# Patient Record
Sex: Female | Born: 1980
Health system: Southern US, Community
[De-identification: ages and names within clinical notes are randomized; demographics above are authoritative.]

---

## 2005-07-21 ENCOUNTER — Emergency Department (HOSPITAL_COMMUNITY): Admission: EM | Admit: 2005-07-21 | Discharge: 2005-07-21 | Payer: Self-pay | Admitting: Family Medicine

## 2005-08-09 ENCOUNTER — Emergency Department (HOSPITAL_COMMUNITY): Admission: EM | Admit: 2005-08-09 | Discharge: 2005-08-09 | Payer: Self-pay | Admitting: Family Medicine

## 2005-10-02 ENCOUNTER — Emergency Department (HOSPITAL_COMMUNITY): Admission: EM | Admit: 2005-10-02 | Discharge: 2005-10-03 | Payer: Self-pay | Admitting: *Deleted

## 2005-10-24 ENCOUNTER — Ambulatory Visit: Payer: Self-pay | Admitting: Obstetrics and Gynecology

## 2005-10-24 ENCOUNTER — Encounter: Payer: Self-pay | Admitting: Obstetrics and Gynecology

## 2005-11-11 ENCOUNTER — Ambulatory Visit: Payer: Self-pay | Admitting: Family Medicine

## 2005-11-11 ENCOUNTER — Encounter (INDEPENDENT_AMBULATORY_CARE_PROVIDER_SITE_OTHER): Payer: Self-pay | Admitting: Specialist

## 2005-11-11 ENCOUNTER — Inpatient Hospital Stay (HOSPITAL_COMMUNITY): Admission: RE | Admit: 2005-11-11 | Discharge: 2005-11-13 | Payer: Self-pay | Admitting: Obstetrics and Gynecology

## 2005-11-28 ENCOUNTER — Ambulatory Visit: Payer: Self-pay | Admitting: Obstetrics & Gynecology

## 2005-12-18 ENCOUNTER — Ambulatory Visit: Payer: Self-pay | Admitting: Obstetrics and Gynecology

## 2005-12-23 ENCOUNTER — Ambulatory Visit: Payer: Self-pay | Admitting: Internal Medicine

## 2006-01-02 ENCOUNTER — Ambulatory Visit: Payer: Self-pay | Admitting: Obstetrics and Gynecology

## 2006-06-30 ENCOUNTER — Emergency Department (HOSPITAL_COMMUNITY): Admission: EM | Admit: 2006-06-30 | Discharge: 2006-06-30 | Payer: Self-pay | Admitting: Family Medicine

## 2006-06-30 ENCOUNTER — Ambulatory Visit (HOSPITAL_COMMUNITY): Admission: RE | Admit: 2006-06-30 | Discharge: 2006-06-30 | Payer: Self-pay | Admitting: Family Medicine

## 2006-07-11 ENCOUNTER — Encounter: Admission: RE | Admit: 2006-07-11 | Discharge: 2006-07-11 | Payer: Self-pay | Admitting: General Surgery

## 2006-07-16 ENCOUNTER — Ambulatory Visit (HOSPITAL_COMMUNITY): Admission: RE | Admit: 2006-07-16 | Discharge: 2006-07-16 | Payer: Self-pay | Admitting: General Surgery

## 2006-08-15 ENCOUNTER — Emergency Department (HOSPITAL_COMMUNITY): Admission: EM | Admit: 2006-08-15 | Discharge: 2006-08-15 | Payer: Self-pay | Admitting: Family Medicine

## 2006-08-26 ENCOUNTER — Ambulatory Visit (HOSPITAL_COMMUNITY): Admission: RE | Admit: 2006-08-26 | Discharge: 2006-08-27 | Payer: Self-pay | Admitting: General Surgery

## 2006-08-26 ENCOUNTER — Encounter (INDEPENDENT_AMBULATORY_CARE_PROVIDER_SITE_OTHER): Payer: Self-pay | Admitting: Specialist

## 2006-10-30 ENCOUNTER — Emergency Department (HOSPITAL_COMMUNITY): Admission: EM | Admit: 2006-10-30 | Discharge: 2006-10-30 | Payer: Self-pay | Admitting: Family Medicine

## 2007-02-26 ENCOUNTER — Ambulatory Visit: Payer: Self-pay | Admitting: *Deleted

## 2008-01-06 ENCOUNTER — Ambulatory Visit: Payer: Self-pay | Admitting: Family Medicine

## 2010-10-09 NOTE — Group Therapy Note (Signed)
NAMEALANTRA, Madison Jordan          ACCOUNT NO.:  [id number]   MEDICAL RECORD NO.:  [id number]          PATIENT TYPE:  WOC   LOCATION:  [facility]                   FACILITY:  [facility]   PHYSICIAN:  Karlton Lemon, MD      DATE OF BIRTH:  Jan 31, 1981   DATE OF SERVICE:                                  CLINIC NOTE   CHIEF COMPLAINT:  Painful urination, discharge, itching and vaginal  odor.   HISTORY OF PRESENT ILLNESS:  This is a 30 year old African American  female with history of vaginal hysterectomy performed on November 11, 2005  by Dr. Okey Dupre presenting with complaints of painful urination for the past  2-3 days.  She also notes that she did have some bloody colored urine  yesterday, though that has resolved now.  She continues to have the pain  with urination.  She also noticed some thin, clearish colored discharge.  She has some vaginal itching and a fishy odor.  She is currently  sexually active with one partner.   PAST MEDICAL HISTORY:  None.   PAST SURGICAL HISTORY:  Vaginal hysterectomy secondary to dysmenorrhea  and adenomyosis.   MEDICATIONS:  None.   ALLERGIES:  ZITHROMAX.   PHYSICAL EXAMINATION:  GENERAL:  This is a well-appearing African  American female in no distress.  VITALS:  Temperature 97.2, pulse 60, blood pressure 106/68.  CARDIOVASCULAR:  Heart is regular rate and rhythm, no murmurs, rubs or  gallops.  RESPIRATORY:  Lungs are clear to auscultation bilaterally.  GENITOURINARY:  Normal female external genitalia, vaginal mucosa is pink  and moist with a whitish thick discharge noted as well as a thin whitish  discharge adherent to the vaginal walls.  Cervix is absent.  Wet prep is  collected.   LABORATORY:  UA was performed, showed no abnormalities.  Wet prep was  preformed showing  a few yeast and few clue cells.  KOH elicited a fishy  odor when applied to the solution, pH was not performed.   ASSESSMENT/PLAN:  This is a 30 year old female presenting with  painful  urination, vaginal discharge, vaginal odor with bacterial vaginosis and  vaginal candidiasis.  1. We will treat for bacterial vaginosis with Flagyl 500 mg p.o.      b.i.d. for 7 days.  2. We will treat for vaginal candidiasis with Diflucan 150 mg times 1      dose then repeat the dose in 3 days.  3. Patient is to follow up if symptoms do not resolve with current      treatment.           ______________________________  Karlton Lemon, MD     NS/MEDQ  D:  02/26/2007  T:  02/27/2007  Job:  [id number]

## 2010-10-12 NOTE — Group Therapy Note (Signed)
NAME:  Madison Jordan, Madison Jordan          ACCOUNT NO.:  [id number]   MEDICAL RECORD NO.:  [id number]          PATIENT TYPE:  WOC   LOCATION:  [facility]                   FACILITY:  [facility]   PHYSICIAN:  Argentina Donovan, MD        DATE OF BIRTH:  May 09, 1981   DATE OF SERVICE:  10/24/2005                                    CLINIC NOTE   SUBJECTIVE:  The patient is a 30 year old gravida 3, para 3-0-0-3 with  children between the age of 2 and 4, who is 6-feet tall and weighs 161  pounds, who comes in with a complaint of increasingly severe disabling  dysmenorrhea, going into her back and into her upper thighs, with increasing  heavy bleeding and clots during her period.  It is bad enough that she was  unable to drive he children to school with her periods lately.  She has a  history since her babies of markedly severe varices of her lower extremities  for a 30 year old young lady.   OBJECTIVE:  PELVIC:  On examination, external genitalia are normal, BUS  within normal limits.  The vagina is clean and well rugate.  The cervix is  clean and parous, hypertrophied to some degree.  The uterus is retroverted  __________ , but mobile out of the cul-de-sac and of normal size, shape and  consistency.  The adnexa are normal.   ASSESSMENT:  My impression is that the patient has probable pelvic varices  and with superimposed pelvic congestion syndrome as well as adenomyosis  aggravated by a retroverted uterus.   PLAN:  I have talked to the patient about conservative treatment, although I  have my doubts that any would work.  She has had a tubal ligation after her  last baby and wants no further children, but says she cannot continue  functioning this way.  I am planning a vaginal hysterectomy because of  intractable dysmenorrhea and menorrhagia with probable adenomyosis and  pelvic congestion.           ______________________________  Argentina Donovan, MD     PR/MEDQ  D:  10/24/2005  T:  10/25/2005  Job:   [id number]

## 2010-10-12 NOTE — Discharge Summary (Signed)
Madison Jordan, Madison Jordan          ACCOUNT NO.:  [id number]   MEDICAL RECORD NO.:  [id number]          PATIENT TYPE:  OIB   LOCATION:  6711                         FACILITY:  [facility]   PHYSICIAN:  Gabrielle Dare. Janee Morn, M.D.DATE OF BIRTH:  19-Jan-1981   DATE OF ADMISSION:  08/26/2006  DATE OF DISCHARGE:  08/27/2006                               DISCHARGE SUMMARY   DISCHARGE DIAGNOSES:  1. Cholelithiasis.  2. Appendicolith.  3. Status post laparoscopic cholecystectomy and intraoperative      angiogram and laparoscopic appendectomy.   HISTORY OF PRESENT ILLNESS:  The patient is a 30 year old female who  yesterday underwent laparoscopic cholecystectomy and cholangiogram &  laparoscopic appendectomy.  The procedure was uncomplicated.   HOSPITAL COURSE:  Postoperative, patient had some nausea and pain  control issues, so she stayed overnight.  The nausea has resolved.  She  is tolerating p.o.  Pain is under better control on oral medication.  She is discharged home in stable condition at this time.   DISCHARGE DIET:  Low-fat.   DISCHARGE ACTIVITIES:  No lifting.   DISCHARGE MEDICATIONS:  Percocet 5/325 two every 6 hours as needed for  pain.   FOLLOWUP:  In 3 weeks      Laurell Josephs E. Janee Morn, M.D.  Electronically Signed     BET/MEDQ  D:  08/27/2006  T:  08/27/2006  Job:  [id number]

## 2010-10-12 NOTE — Op Note (Signed)
NAMEDEANNDRA, KIRLEY          ACCOUNT NO.:  [id number]   MEDICAL RECORD NO.:  [id number]          PATIENT TYPE:  OIB   LOCATION:  2854                         FACILITY:  [facility]   PHYSICIAN:  Gabrielle Dare. Janee Morn, M.D.DATE OF BIRTH:  Jan 13, 1981   DATE OF PROCEDURE:  08/26/2006  DATE OF DISCHARGE:                               OPERATIVE REPORT   PREOPERATIVE DIAGNOSES:  1. Cholelithiasis.  2. Appendicolith.   POSTOPERATIVE DIAGNOSES:  1. Cholelithiasis.  2. Appendicolith.   PROCEDURE:  1. Laparoscopic cholecystectomy with intraoperative cholangiogram.  2. Laparoscopic appendectomy.   SURGEON:  Gabrielle Dare. Janee Morn, M.D.   ASSISTANT:  Anselm Pancoast. Zachery Dakins, M.D.   ANESTHESIA:  General.   HISTORY OF PRESENT ILLNESS:  The patient is a 30 year old African-  American female with chronic episodic right upper quadrant and right  flank pain.  Evaluation included abdominal ultrasound which demonstrated  multiple gallstones.  In addition, due to her right flank pain, a CT  scan of the abdomen and pelvis was obtained to evaluate for a  nephrolithiasis that was not found, but she was found to have an  appendicolith, so we are proceeding today with elective laparoscopic  cholecystectomy with cholangiogram and laparoscopic appendectomy.   PROCEDURE IN DETAIL:  Informed consent was obtained.  The patient  received intravenous antibiotics.  She was brought to the operating  room.  General anesthesia was administered.  Her abdomen was prepped and  draped in a sterile fashion.  The patient was identified prior to  starting the procedure.  Marcaine 0.25% with epinephrine was injected in  the infraumbilical region along the previous scar from her laparoscopic-  assisted hysterectomy.  A transverse infraumbilical incision was made,  subcutaneous tissues were dissected down, revealing the fascia.  This  was divided sharply and the peritoneal cavity was entered gently under  direct vision without  difficulty.  A 0 Vicryl pursestring suture was  placed around the fascial opening and the Hasson trocar was inserted  into the abdomen and the abdomen was insufflated with carbon dioxide in  a standard fashion.  Under direct vision, an 11-mm epigastric and two 5-  mm lateral ports were placed.  Marcaine 0.25% with epinephrine was used  at all port sites.  The dome of the gallbladder was retracted  superomedially over filmy omental adhesions, along the entire length of  the gallbladder.  These were taken down with dissection and Bovie  cautery revealing the infundibulum.  This was retracted inferolaterally.  Dissection began laterally and progressed medially, easily identifying  the cystic duct and the cystic artery.  Dissection continued on the  cystic duct until a large window was made between the cystic duct, the  infundibulum over the gallbladder and the liver.  Once this was  accomplished, a clip was placed on the infundibulocystic duct junction.  Next, a small nick was made in the cystic duct and a Reddick  cholangiogram catheter was inserted.  Intraoperative cholangiogram was  obtained, demonstrating no common bile duct filling defects and a good  length of cystic duct.  Contrast flowed easily into the duodenum.  Cholangiogram catheter was removed.  Three  clips were placed proximally  on the cystic duct and it was divided.  The cystic artery was clipped  twice proximally and once distally and divided as well.  The gallbladder  was taken off the liver bed with a Bovie cautery.  It was placed in an  EndoCatch bag and removed from the abdomen via the infraumbilical port  site.  The liver bed was rechecked.  Meticulous hemostasis was assured.  The area was irrigated and the irrigation fluid returned clear.  Clips  remained in excellent position and the liver bed was dry.  Attention was  then directed to the appendix.  There were some small omental adhesions  down around the umbilical  region.  These were taken down with sharp  dissection and cautery.  They were filmy omental adhesions.  The  appendix was retracted superiorly.  The mesentery was dissected near the  base after taking down some of the filmy peritoneum.  The mesentery was  then divided with a single firing of the endoscopic GIA with a vascular  load.  The base of the appendix was then further dissected and the base  was divided with endoscopic GIA with vascular load.  Excellent  hemostasis was assured.  The appendix was placed in an EndoCatch bag and  removed from the abdomen via the epigastric port site.  The staple lines  remained intact with no hemorrhage.  The abdomen was copiously  irrigated.  The irrigation fluid returned clear.  The remainder of the  irrigation fluid was evacuated.  Staple lines were rechecked and were  dry.  Ports were then removed under direct vision.  Pneumoperitoneum was  released.  The Hasson trocar was removed.  The infraumbilical fascia was  closed by tying the 0 Vicryl pursestring suture, with care not to trap  any intraabdominal contents.  All four wounds were copiously irrigated  and the skin of each was closed with a running 4-0 Vicryl subcuticular  stitch.  Sponge, needle, and instrument counts were correct.  Benzoin,  Steri-Strips, and sterile dressings were applied.  The patient tolerated  the procedure well without apparent complication and was taken to  recovery room in stable condition.      Gabrielle Dare Janee Morn, M.D.  Electronically Signed     BET/MEDQ  D:  08/26/2006  T:  08/26/2006  Job:  [id number]   cc:   [facility]

## 2010-10-12 NOTE — Op Note (Signed)
[name], [name]          ACCOUNT NO.:  [id number]   MEDICAL RECORD NO.:  [id number]          PATIENT TYPE:  OIB   LOCATION:  9309                          [facility]ACILITY[facility]   PHYSICIAN:  Tanya S. Shawnie Pons, M.D.   DATE OF BIRTH:  March 23, 1981   DATE OF PROCEDURE:  11/11/2005  DATE OF DISCHARGE:                                 OPERATIVE REPORT   PREOPERATIVE DIAGNOSES:  1.  Pelvic pain.  2.  Dysmenorrhea.  3.  Adenomyosis.   POSTOPERATIVE DIAGNOSIS:  1.  Pelvic pain.  2.  Dysmenorrhea.  3.  Adenomyosis.   PROCEDURE:  Transvaginal hysterectomy.   SURGEON:  Shelbie Proctor. Shawnie Pons, M.D.   ASSISTANTKaroline Caldwell B. Merlene Morse, MD  Javier Glazier. Okey Dupre, M.D.   ANESTHESIA:  General and local.   SPECIMEN:  Uterus.   ESTIMATED BLOOD LOSS:  400 mL.   COMPLICATIONS:  None.   INDICATIONS FOR PROCEDURE:  The patient is a 30 year old para 3 who has  longstanding history of dysmenorrhea and pelvic pain and desired permanent  treatment.   PROCEDURE:  The patient was taken to the OR.  She was placed in the dorsal  lithotomy in Bullard stirrups.  She was prepped and draped in the usual  sterile fashion.  A weighted speculum was placed in the vagina.  The cervix  was grasped anteriorly and posteriorly with double-tooth tenaculum, injected  with 10 mL of 1% lidocaine with epinephrine and a circumferential incision  was made with a knife.  This was carried down and then blunt dissection was  used to dissect the bladder and posterior peritoneum off the uterus.  The  posterior peritoneal cavity was entered sharply and the posterior peritoneum  tagged to the posterior vaginal cuff without difficulty.  There was some  difficulty in getting in anteriorly.  The leaf of the uterosacral ligaments  were clamped, divided and suture ligated with a Heaney type stitch and held.  The uterine arteries were sequentially clamped, cut and suture ligated.  Another attempt was made to get in anteriorly and the peritoneal cavity  was  then entered sharply without difficulty.  The right tubo-ovarian pedicles  were then clamped, cut and free tie used to secure them followed by a suture  ligature bilaterally.  At that point, there appeared to be some oozing from  the posterior cuff and this was gotten with a locked running suture.  There  appeared to be some bleeding from the patient's right tubo-ovarian pedicle.  This was re-grasped sequentially, actually bringing the ovary up with a  Babcock clamp with several Heaneys and suture ligatures placed across this  until hemostasis was again noted.  Once hemostasis was felt to be adequate,  the two uterosacral pedicles were tied together and the cuff closed in a  running stitch.  Foley was placed inside the bladder.  Clear yellow urine  was noted.  The vagina was packed with 1-inch packing covered with Estrace  cream.  All instrument, needle, lap counts were correct x2.  The patient was  awakened and taken to the recovery room in stable condition.  ______________________________  Shelbie Proctor Shawnie Pons, M.D.     TSP/MEDQ  D:  11/11/2005  T:  11/12/2005  Job:  [id number]

## 2010-10-12 NOTE — Discharge Summary (Signed)
NAMEULANI, DEGRASSE          ACCOUNT NO.:  [id number]   MEDICAL RECORD NO.:  [id number]          PATIENT TYPE:  OIB   LOCATION:  9309                          [facility]   PHYSICIAN:  Phil D. Okey Dupre, M.D.     DATE OF BIRTH:  [birth year]   DATE OF ADMISSION:  11/11/2005  DATE OF DISCHARGE:  11/13/2005                                 DISCHARGE SUMMARY   SUMMARY:  The patient 30 year old gravida 3, para 3-0-0-3 with severe  disabling dysmenorrhea and menorrhagia who underwent total vaginal  hysterectomy on the day of admission.  Pathology is not yet available.  She  has done very well postoperatively, has been afebrile during the entire  course.  Has had stable hematocrit since surgery with the hematocrit of 33.5  and hemoglobin 11.5.  Is being sent home on Percocet for pain.  Has been  given detailed instructions as to activity, especially stairs and lifting  and driving.  As to follow up which will be the clinic in 2 weeks.  As to  diet and precautions as what to look for, high fever, heavy bleeding, etc.  She has also been encouraged to start iron once she is having normal bowel  movements.   IMPRESSION:  Satisfactory recovery following total vaginal hysterectomy.           ______________________________  Javier Glazier Okey Dupre, M.D.     PDR/MEDQ  D:  11/13/2005  T:  11/13/2005  Job:  [id number]

## 2010-10-12 NOTE — Group Therapy Note (Signed)
NAMEBONNEY, Madison Jordan NO.:  [id number]   MEDICAL RECORD NO.:  [id number]          PATIENT TYPE:  WOC   LOCATION:  [facility]                   FACILITY:  [facility]   PHYSICIAN:  Dorthula Perfect, MD     DATE OF BIRTH:  06-16-1980   DATE OF SERVICE:  11/28/2005                                    CLINIC NOTE   This 30 year old multigravid African-American female underwent vaginal  hysterectomy by Dr. Okey Dupre on November 11, 2005.  Details are in the chart.  She  returns today for postop followup.   She is doing reasonably well.  Her problem with constipation is beginning to  clear.  She has noted a little bit of slight spotting for which she uses one  or two minipads a day. She is not having intercourse.  She is having a  little bit of difficulty getting to sleep at night.   PHYSICAL EXAMINATION:  Abdomen is soft and nontender.  Pelvic examination:  External genitalia and BUS glands are normal.  Speculum examination reveals  a fairly long and deep vagina.  Vaginal cuff is difficult to visualize.  I  do not see any obvious granulation tissue.  There is a minimum amount of a  pinkish stain.  There is no active bleeding noted.  Bimanual exam reveals  vaginal cuff to be soft and nontender.  The adnexal areas are normal.   IMPRESSION:  Status post vaginal hysterectomy--three weeks.   DISPOSITION:  1.  The patient is asked to avoid sexual intercourse for at least the next 3-      4 weeks.  She may resume her housework, climbing of stairs but no real      heavy lifting.  2.  She will return in 2-3 weeks to be examined again.  If there is obvious      granulation tissue and the spotting has not changed, that will be      treated with silver nitrate.  3.  She is given a certification form for her mother to enable her mother to      get some funds for the time she spent caring for the children during her      daughter's time in the hospital.     ______________________________  Dorthula Perfect, MD     ER/MEDQ  D:  11/28/2005  T:  11/28/2005  Job:  [id number]

## 2010-10-12 NOTE — H&P (Signed)
Madison Jordan, Madison Jordan          ACCOUNT NO.:  [id number]   MEDICAL RECORD NO.:  [id number]         PATIENT TYPE:  WAMB   LOCATION:                                [facility]   PHYSICIAN:  Phil D. Okey Dupre, M.D.     DATE OF BIRTH:  1980-07-01   DATE OF ADMISSION:  DATE OF DISCHARGE:                                HISTORY & PHYSICAL   CHIEF COMPLAINT:  Pain in the lower abdomen.   HISTORY OF PRESENT ILLNESS:  The patient is a 30 year old gravida 3, para 3-  0-0-3 with children between the ages of 2 and 4 who is 6-foot tall and  weighs 161 pounds. Comes in with a complaint of increasing severe disabling  dysmenorrhea going to her back and into her upper thighs with increasing  heavy bleeding and clots during her period. It has been enough that she has  been unable to drive her children to school when she has a period lately.  She has a history since the birth of her babies of markedly severe varices  of her lower extremities, much more than one would normally see in a 30 year  old.   ALLERGIES:  The patient has no medical allergies.   MEDICATIONS:  At the present time, she is on no medications except for  ibuprofen for pain and sometimes takes Percocet.   FAMILY HISTORY:  Her mother and her grandmother both had cervical cancer and  hysterectomies at an early age.   PAST HISTORY:  Noncontributory except for the birth of her three babies  vaginally.   REVIEW OF SYSTEMS:  The patient is in good health with no complaints outside  of those related to her pelvic pain which recently has extended throughout  her period but mainly increased during the time just before and during her  period.   PHYSICAL EXAMINATION:  VITAL SIGNS:  Temperature 98.7, pulse 58, blood  pressure 114/75, weight 161, height 6 foot tall.  GENERAL:  Well-developed, tall, slender female in no acute distress.  HEENT:  Within normal limits.  NECK:  Supple with no thyroid masses. Thyroid is symmetrical.  LUNGS:   Clear to auscultation and percussion.  HEART:  No murmur. Normal sinus rhythm. PMI 5th IS and MCL.  BREASTS:  Symmetrical with no dominant masses and no nipple discharge.  ABDOMEN:  Soft, flat, nontender. No masses. No organomegaly.  ADENOPATHY:  None noted.  GENITALIA:  External genitalia is normal. BUS within normal limits. Vagina  is clean, pink and well rugated. The cervix is clean. There is hypertrophy  to some degree. The uterus is retroverted but mobile out of the cul-de-sac  and of normal size, shape and consistency. The adnexa is normal.   IMPRESSION:  Chronic pelvic pain with increasing dysmenorrhea and  menorrhagia. My impression is that patient probably has pelvic varices with  superimposed pelvic congestion syndrome as well as adenomyosis aggravated by  retroverted uterus.   PLAN:  I have talked to the patient about conservative treatment, although I  doubt that any would work. She has had a tubal ligation after her last baby  and  wants no further children, and she says she cannot continue functioning  this way. Planning a vaginal hysterectomy because of intractable  dysmenorrhea and menorrhagia with probable adenomyosis and pelvic  congestion. I met today with the patient as well as her mother, and we went  over the procedure as well as the possibility of occasionally having to  switch to the abdominal route. The mother has experienced this because she  has had abdominal hysterectomy for cervical cancer. We discussed the  possible risks, went over the most common, especially those related to  anesthesia, those related to injury to urinary tract or gastrointestinal  tract, hemorrhage and infection. We discussed the sexual connotation of  hysterectomy and also the limitations postoperative, how long they would be,  the type of activity that we would expect of the patient.           ______________________________  Javier Glazier. Okey Dupre, M.D.     PDR/MEDQ  D:  11/05/2005  T:   11/05/2005  Job:  [id number]

## 2011-03-07 LAB — POCT URINALYSIS DIP (DEVICE)
Hgb urine dipstick: NEGATIVE
Nitrite: NEGATIVE
Protein, ur: NEGATIVE
Urobilinogen, UA: 0.2
pH: 6

## 2015-05-01 ENCOUNTER — Encounter: Payer: Self-pay | Admitting: Nurse Practitioner

## 2016-01-24 DIAGNOSIS — R5383 Other fatigue: Secondary | ICD-10-CM | POA: Diagnosis not present

## 2016-01-24 DIAGNOSIS — M4120 Other idiopathic scoliosis, site unspecified: Secondary | ICD-10-CM | POA: Diagnosis not present

## 2016-01-24 DIAGNOSIS — F329 Major depressive disorder, single episode, unspecified: Secondary | ICD-10-CM | POA: Diagnosis not present

## 2016-01-24 DIAGNOSIS — F419 Anxiety disorder, unspecified: Secondary | ICD-10-CM | POA: Diagnosis not present

## 2016-01-24 DIAGNOSIS — M791 Myalgia: Secondary | ICD-10-CM | POA: Diagnosis not present

## 2016-01-24 DIAGNOSIS — R197 Diarrhea, unspecified: Secondary | ICD-10-CM | POA: Diagnosis not present

## 2017-08-30 DIAGNOSIS — R1010 Upper abdominal pain, unspecified: Secondary | ICD-10-CM | POA: Diagnosis not present

## 2017-10-21 DIAGNOSIS — R102 Pelvic and perineal pain: Secondary | ICD-10-CM | POA: Diagnosis not present

## 2017-10-21 DIAGNOSIS — R1031 Right lower quadrant pain: Secondary | ICD-10-CM | POA: Diagnosis not present

## 2017-10-21 DIAGNOSIS — F419 Anxiety disorder, unspecified: Secondary | ICD-10-CM | POA: Diagnosis not present
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
## 2017-11-13 ENCOUNTER — Ambulatory Visit: Payer: BLUE CROSS/BLUE SHIELD | Admitting: Family Medicine

## 2017-11-13 ENCOUNTER — Ambulatory Visit (INDEPENDENT_AMBULATORY_CARE_PROVIDER_SITE_OTHER)
Admission: RE | Admit: 2017-11-13 | Discharge: 2017-11-13 | Disposition: A | Discharge status: Home / Self Care | Payer: BLUE CROSS/BLUE SHIELD | Source: Ambulatory Visit | Attending: Family Medicine | Admitting: Family Medicine

## 2017-11-13 VITALS — BP 94/58 | HR 66 | Ht 72.0 in | Wt 165.0 lb

## 2017-11-13 DIAGNOSIS — M359 Systemic involvement of connective tissue, unspecified: Secondary | ICD-10-CM | POA: Diagnosis not present

## 2017-11-13 DIAGNOSIS — M545 Low back pain, unspecified: Secondary | ICD-10-CM

## 2017-11-13 DIAGNOSIS — M999 Biomechanical lesion, unspecified: Secondary | ICD-10-CM | POA: Diagnosis not present

## 2017-11-13 DIAGNOSIS — G8929 Other chronic pain: Secondary | ICD-10-CM | POA: Diagnosis not present

## 2017-11-13 NOTE — Patient Instructions (Signed)
Good to see you  Madison Jordan is your friend.  pennsaid pinkie amount topically 2 times daily as needed.  Exercises 3 times a week.  Xrays downstairs Over the counter get Vitamin D 2000 IU daily and turmeric 500mg  1-2 times daily  See me again in 4 weeks

## 2017-11-14 ENCOUNTER — Encounter: Payer: Self-pay | Admitting: Family Medicine

## 2017-11-14 DIAGNOSIS — M545 Low back pain, unspecified: Secondary | ICD-10-CM | POA: Insufficient documentation

## 2017-11-14 DIAGNOSIS — M999 Biomechanical lesion, unspecified: Secondary | ICD-10-CM | POA: Insufficient documentation

## 2017-11-14 DIAGNOSIS — M359 Systemic involvement of connective tissue, unspecified: Secondary | ICD-10-CM | POA: Insufficient documentation

## 2017-11-14 NOTE — Assessment & Plan Note (Signed)
Referred to genetics for further evaluation.

## 2017-11-14 NOTE — Assessment & Plan Note (Signed)
Decision today to treat with OMT was based on Physical Exam  After verbal consent patient was treated with HVLA, ME, FPR techniques in  thoracic, lumbar and sacral areas  Patient tolerated the procedure well with improvement in symptoms  Patient given exercises, stretches and lifestyle modifications  See medications in patient instructions if given  Patient will follow up in 4 weeks 

## 2017-11-14 NOTE — Assessment & Plan Note (Signed)
Low back pain.  Patient does have some mild scoliosis noted that is likely contributing.  Had do believe the patient may have some mild degenerative changes already.  I believe that there is an underlying possible with connective tissue disorder as well.  I would like to refer patient for further evaluation.  We discussed this at great length.  Patient is understandable for the work-up.  Does have 3 daughters who also seem to have hypermobility she states.  Has never had chest pain no shortness of breath out of the ordinary.  Patient responded fairly well to home exercises as well as manipulation.  Patient will follow-up with me again in 4 weeks

## 2017-12-10 NOTE — Progress Notes (Signed)
[facility] [address] [place], [place] [address] Phone: [phone] Subjective:     CC: Right hip pain.    [id number]  Madison Jordan is a 37 y.o. female coming in with complaint of right hip pain. States that when she bends she feels pain. Her back pain has otherwise improved since last visit. Patient was seen previously and had more of a sacroiliac dysfunction as well as hypermobility syndrome.  Has been doing all the conservative therapies making progress.  Still having some discomfort though in the right side of the back.  No significant radiation down the leg at the moment.  Rates the severity of pain is 7 out of 10 Patient did have x-rays at last exam in the emergency room found to be unremarkable.    No past medical history on file. No past surgical history on file. Social History   Socioeconomic History  . Marital status: Married    Spouse name: Not on file  . Number of children: Not on file  . Years of education: Not on file  . Highest education level: Not on file  Occupational History  . Not on file  Social Needs  . Financial resource strain: Not on file  . Food insecurity:    Worry: Not on file    Inability: Not on file  . Transportation needs:    Medical: Not on file    Non-medical: Not on file  Tobacco Use  . Smoking status: Not on file  Substance and Sexual Activity  . Alcohol use: Not on file  . Drug use: Not on file  . Sexual activity: Not on file  Lifestyle  . Physical activity:    Days per week: Not on file    Minutes per session: Not on file  . Stress: Not on file  Relationships  . Social connections:    Talks on phone: Not on file    Gets together: Not on file    Attends religious service: Not on file    Active member of club or organization: Not on file    Attends meetings of clubs or organizations: Not on file    Relationship status: Not on file  Other Topics Concern  . Not on file  Social History  Narrative  . Not on file   Not on File No family history on file.  No family history of autoimmune   Past medical history, social, surgical and family history all reviewed in electronic medical record.  No pertanent information unless stated regarding to the chief complaint.   Review of Systems:Review of systems updated and as accurate as of 12/11/17  No headache, visual changes, nausea, vomiting, diarrhea, constipation, dizziness, abdominal pain, skin rash, fevers, chills, night sweats, weight loss, swollen lymph nodes, body aches, joint swelling, muscle aches, chest pain, shortness of breath, mood changes.   Objective  Blood pressure 102/72, pulse 60, height 6' (1.829 m), weight 196 lb (88.9 kg), SpO2 98 %. Systems examined below as of 12/11/17   General: No apparent distress alert and oriented x3 mood and affect normal, dressed appropriately.  HEENT: Pupils equal, extraocular movements intact  Respiratory: Patient's speak in full sentences and does not appear short of breath  Cardiovascular: No lower extremity edema, non tender, no erythema  Skin: Warm dry intact with no signs of infection or rash on extremities or on axial skeleton.  Abdomen: Soft nontender  Neuro: Cranial nerves II through XII are intact, neurovascularly intact in  all extremities with 2+ DTRs and 2+ pulses.  Lymph: No lymphadenopathy of posterior or anterior cervical chain or axillae bilaterally.  Gait normal with good balance and coordination.  MSK:  Non tender with full range of motion and good stability and symmetric strength and tone of shoulders, elbows, wrist, hip, knee and ankles bilaterally.  Hypermobility noted Back Exam:  Inspection: Increased lordosis Motion: Flexion 45 deg, Extension 25 deg, Side Bending to 35 deg bilaterally,  Rotation to 35 deg bilaterally  SLR laying: Negative  XSLR laying: Negative  Palpable tenderness: Tender to palpation the paraspinal musculature of the lumbar spine right  greater than left. FABER: positive right. Sensory change: Gross sensation intact to all lumbar and sacral dermatomes.  Reflexes: 2+ at both patellar tendons, 2+ at achilles tendons, Babinski's downgoing.  Strength at foot  Plantar-flexion: 5/5 Dorsi-flexion: 5/5 Eversion: 5/5 Inversion: 5/5  Leg strength  Quad: 5/5 Hamstring: 5/5 Hip flexor: 5/5 Hip abductors: 4/5 but symmetric Gait unremarkable.  Osteopathic findings C2 flexed rotated and side bent right C4 flexed rotated and side bent left T3 extended rotated and side bent right inhaled third rib T9 extended rotated and side bent left L2 flexed rotated and side bent right Sacrum right on right    Impression and Recommendations:     This case required medical decision making of moderate complexity.      Note: This dictation was prepared with Dragon dictation along with smaller phrase technology. Any transcriptional errors that result from this process are unintentional.

## 2017-12-11 ENCOUNTER — Ambulatory Visit: Payer: BLUE CROSS/BLUE SHIELD | Admitting: Family Medicine

## 2017-12-11 VITALS — BP 102/72 | HR 60 | Ht 72.0 in | Wt 196.0 lb

## 2017-12-11 DIAGNOSIS — M999 Biomechanical lesion, unspecified: Secondary | ICD-10-CM | POA: Diagnosis not present

## 2017-12-11 DIAGNOSIS — M359 Systemic involvement of connective tissue, unspecified: Secondary | ICD-10-CM

## 2017-12-11 NOTE — Assessment & Plan Note (Signed)
Decision today to treat with OMT was based on Physical Exam  After verbal consent patient was treated with HVLA, ME, FPR techniques in cervical, thoracic, rib, lumbar and sacral areas  Patient tolerated the procedure well with improvement in symptoms  Patient given exercises, stretches and lifestyle modifications  See medications in patient instructions if given  Patient will follow up in 4-6 weeks 

## 2017-12-11 NOTE — Patient Instructions (Signed)
5-6 weeks

## 2017-12-11 NOTE — Assessment & Plan Note (Signed)
Patient still has a significant hypermobility that I think is contributing to most of the discomfort and pain.  Discussed icing regimen and home exercises.  Discussed which activities of doing which wants to avoid. Patient is making progress.  Follow-up with me again in 4 to 6 weeks

## 2018-01-29 DIAGNOSIS — M19042 Primary osteoarthritis, left hand: Secondary | ICD-10-CM | POA: Diagnosis not present

## 2018-02-17 DIAGNOSIS — F4321 Adjustment disorder with depressed mood: Secondary | ICD-10-CM | POA: Diagnosis not present

## 2018-02-24 DIAGNOSIS — F4321 Adjustment disorder with depressed mood: Secondary | ICD-10-CM | POA: Diagnosis not present

## 2018-03-03 DIAGNOSIS — F4321 Adjustment disorder with depressed mood: Secondary | ICD-10-CM | POA: Diagnosis not present

## 2018-03-10 DIAGNOSIS — F4321 Adjustment disorder with depressed mood: Secondary | ICD-10-CM | POA: Diagnosis not present

## 2018-03-17 DIAGNOSIS — F4321 Adjustment disorder with depressed mood: Secondary | ICD-10-CM | POA: Diagnosis not present

## 2018-04-03 DIAGNOSIS — F4321 Adjustment disorder with depressed mood: Secondary | ICD-10-CM | POA: Diagnosis not present

## 2018-04-14 DIAGNOSIS — F4321 Adjustment disorder with depressed mood: Secondary | ICD-10-CM | POA: Diagnosis not present

## 2018-04-17 DIAGNOSIS — F4321 Adjustment disorder with depressed mood: Secondary | ICD-10-CM | POA: Diagnosis not present

## 2018-05-01 DIAGNOSIS — F4321 Adjustment disorder with depressed mood: Secondary | ICD-10-CM | POA: Diagnosis not present

## 2018-07-28 DIAGNOSIS — R109 Unspecified abdominal pain: Secondary | ICD-10-CM | POA: Diagnosis not present

## 2018-07-31 DIAGNOSIS — Z131 Encounter for screening for diabetes mellitus: Secondary | ICD-10-CM | POA: Diagnosis not present

## 2018-07-31 DIAGNOSIS — R5383 Other fatigue: Secondary | ICD-10-CM | POA: Diagnosis not present

## 2018-07-31 DIAGNOSIS — R1084 Generalized abdominal pain: Secondary | ICD-10-CM | POA: Diagnosis not present

## 2018-07-31 DIAGNOSIS — R635 Abnormal weight gain: Secondary | ICD-10-CM | POA: Diagnosis not present

## 2018-08-10 DIAGNOSIS — M542 Cervicalgia: Secondary | ICD-10-CM | POA: Diagnosis not present

## 2018-08-10 DIAGNOSIS — M545 Low back pain: Secondary | ICD-10-CM | POA: Diagnosis not present

## 2018-08-10 DIAGNOSIS — M419 Scoliosis, unspecified: Secondary | ICD-10-CM | POA: Diagnosis not present

## 2018-08-10 DIAGNOSIS — N8301 Follicular cyst of right ovary: Secondary | ICD-10-CM | POA: Diagnosis not present

## 2018-08-10 DIAGNOSIS — R102 Pelvic and perineal pain: Secondary | ICD-10-CM | POA: Diagnosis not present

## 2018-08-10 DIAGNOSIS — M546 Pain in thoracic spine: Secondary | ICD-10-CM | POA: Diagnosis not present

## 2018-08-11 DIAGNOSIS — M25559 Pain in unspecified hip: Secondary | ICD-10-CM | POA: Diagnosis not present

## 2018-08-11 DIAGNOSIS — M542 Cervicalgia: Secondary | ICD-10-CM | POA: Diagnosis not present

## 2018-08-11 DIAGNOSIS — M545 Low back pain: Secondary | ICD-10-CM | POA: Diagnosis not present

## 2018-08-13 DIAGNOSIS — M25559 Pain in unspecified hip: Secondary | ICD-10-CM | POA: Diagnosis not present

## 2018-08-13 DIAGNOSIS — M542 Cervicalgia: Secondary | ICD-10-CM | POA: Diagnosis not present

## 2018-08-13 DIAGNOSIS — M545 Low back pain: Secondary | ICD-10-CM | POA: Diagnosis not present

## 2018-08-18 DIAGNOSIS — M545 Low back pain: Secondary | ICD-10-CM | POA: Diagnosis not present

## 2018-08-18 DIAGNOSIS — M542 Cervicalgia: Secondary | ICD-10-CM | POA: Diagnosis not present

## 2018-08-20 DIAGNOSIS — M545 Low back pain: Secondary | ICD-10-CM | POA: Diagnosis not present

## 2018-08-20 DIAGNOSIS — M542 Cervicalgia: Secondary | ICD-10-CM | POA: Diagnosis not present

## 2018-08-27 DIAGNOSIS — M545 Low back pain: Secondary | ICD-10-CM | POA: Diagnosis not present
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
## 2018-09-03 DIAGNOSIS — M545 Low back pain: Secondary | ICD-10-CM | POA: Diagnosis not present

## 2018-09-03 DIAGNOSIS — M542 Cervicalgia: Secondary | ICD-10-CM | POA: Diagnosis not present

## 2018-09-10 DIAGNOSIS — M542 Cervicalgia: Secondary | ICD-10-CM | POA: Diagnosis not present

## 2018-09-10 DIAGNOSIS — M546 Pain in thoracic spine: Secondary | ICD-10-CM | POA: Diagnosis not present

## 2018-10-02 DIAGNOSIS — I82503 Chronic embolism and thrombosis of unspecified deep veins of lower extremity, bilateral: Secondary | ICD-10-CM | POA: Diagnosis not present

## 2018-10-02 DIAGNOSIS — R0602 Shortness of breath: Secondary | ICD-10-CM | POA: Diagnosis not present

## 2018-10-02 DIAGNOSIS — I719 Aortic aneurysm of unspecified site, without rupture: Secondary | ICD-10-CM | POA: Diagnosis not present

## 2018-10-02 DIAGNOSIS — R079 Chest pain, unspecified: Secondary | ICD-10-CM | POA: Diagnosis not present

## 2018-10-03 DIAGNOSIS — I82503 Chronic embolism and thrombosis of unspecified deep veins of lower extremity, bilateral: Secondary | ICD-10-CM | POA: Diagnosis not present

## 2018-10-03 DIAGNOSIS — R079 Chest pain, unspecified: Secondary | ICD-10-CM | POA: Diagnosis not present

## 2018-10-05 DIAGNOSIS — I82503 Chronic embolism and thrombosis of unspecified deep veins of lower extremity, bilateral: Secondary | ICD-10-CM | POA: Diagnosis not present

## 2018-10-12 DIAGNOSIS — I719 Aortic aneurysm of unspecified site, without rupture: Secondary | ICD-10-CM | POA: Diagnosis not present

## 2019-10-21 ENCOUNTER — Ambulatory Visit: Payer: BLUE CROSS/BLUE SHIELD | Admitting: Family Medicine

## 2019-10-21 NOTE — Progress Notes (Deleted)
[facility] [address] [place] [address] Phone: [phone] Subjective:    I'm seeing this patient by the request  of:  Iona Hansen, NP  CC:   POE:[id number]  Madison Jordan is a 39 y.o. female coming in with complaint of back pain. Last seen on 12/11/2017 for OMT. Patient states        No past medical history on file. No past surgical history on file. Social History   Socioeconomic History  . Marital status: Married    Spouse name: Not on file  . Number of children: Not on file  . Years of education: Not on file  . Highest education level: Not on file  Occupational History  . Not on file  Tobacco Use  . Smoking status: Not on file  Substance and Sexual Activity  . Alcohol use: Not on file  . Drug use: Not on file  . Sexual activity: Not on file  Other Topics Concern  . Not on file  Social History Narrative  . Not on file   Social Determinants of Health   Financial Resource Strain:   . Difficulty of Paying Living Expenses:   Food Insecurity:   . Worried About Programme researcher, broadcasting/film/video in the Last Year:   . Barista in the Last Year:   Transportation Needs:   . Freight forwarder (Medical):   Marland Kitchen Lack of Transportation (Non-Medical):   Physical Activity:   . Days of Exercise per Week:   . Minutes of Exercise per Session:   Stress:   . Feeling of Stress :   Social Connections:   . Frequency of Communication with Friends and Family:   . Frequency of Social Gatherings with Friends and Family:   . Attends Religious Services:   . Active Member of Clubs or Organizations:   . Attends Banker Meetings:   Marland Kitchen Marital Status:    Not on File No family history on file. No current outpatient medications on file.   Reviewed prior external information including notes and imaging from  primary care provider As well as notes that were available from care everywhere and other healthcare systems.  Past  medical history, social, surgical and family history all reviewed in electronic medical record.  No pertanent information unless stated regarding to the chief complaint.   Review of Systems:  No headache, visual changes, nausea, vomiting, diarrhea, constipation, dizziness, abdominal pain, skin rash, fevers, chills, night sweats, weight loss, swollen lymph nodes, body aches, joint swelling, chest pain, shortness of breath, mood changes. POSITIVE muscle aches  Objective  There were no vitals taken for this visit.   General: No apparent distress alert and oriented x3 mood and affect normal, dressed appropriately.  HEENT: Pupils equal, extraocular movements intact  Respiratory: Patient's speak in full sentences and does not appear short of breath  Cardiovascular: No lower extremity edema, non tender, no erythema  Neuro: Cranial nerves II through XII are intact, neurovascularly intact in all extremities with 2+ DTRs and 2+ pulses.  Gait normal with good balance and coordination.  MSK:  Non tender with full range of motion and good stability and symmetric strength and tone of shoulders, elbows, wrist, hip, knee and ankles bilaterally.     Impression and Recommendations:     This case required medical decision making of moderate complexity. The above documentation has been reviewed and is accurate and complete Wilford Grist  Note: This dictation was prepared with Dragon dictation along with smaller phrase technology. Any transcriptional errors that result from this process are unintentional.
